# Patient Record
Sex: Male | Born: 1980 | Hispanic: Yes | Marital: Single | Smoking: Never smoker
Health system: Southern US, Community
[De-identification: ages and names within clinical notes are randomized; demographics above are authoritative.]

---

## 2013-12-15 ENCOUNTER — Emergency Department (HOSPITAL_COMMUNITY)
Admission: EM | Admit: 2013-12-15 | Discharge: 2013-12-15 | Disposition: A | Discharge status: Home / Self Care | Payer: No Typology Code available for payment source | Attending: Emergency Medicine | Admitting: Emergency Medicine

## 2013-12-15 ENCOUNTER — Encounter (HOSPITAL_COMMUNITY): Payer: Self-pay | Admitting: Emergency Medicine

## 2013-12-15 DIAGNOSIS — H902 Conductive hearing loss, unspecified: Secondary | ICD-10-CM | POA: Insufficient documentation

## 2013-12-15 DIAGNOSIS — H6120 Impacted cerumen, unspecified ear: Secondary | ICD-10-CM | POA: Insufficient documentation

## 2013-12-15 MED ORDER — ANTIPYRINE-BENZOCAINE 5.4-1.4 % OT SOLN: 3.0000 [drp] | OTIC | Status: AC | PRN

## 2013-12-15 MED ORDER — DOCUSATE SODIUM 50 MG/5ML PO LIQD
50.0000 mg | Freq: Once | ORAL | Status: AC
  Administered 2013-12-15: 50 mg via ORAL
  Filled 2013-12-15: qty 10

## 2013-12-15 NOTE — ED Provider Notes (Signed)
Medical screening examination/treatment/procedure(s) were performed by non-physician practitioner and as supervising physician I was immediately available for consultation/collaboration.   EKG Interpretation None        Zakyria Metzinger N Karlye Ihrig, DO 12/15/13 2259 

## 2013-12-15 NOTE — Discharge Instructions (Signed)
Apply colace to left ear and left sit for 10-15 minutes.  Irrigate ear afterwards for several minutes. Avoid putting q-tips or other objects into ears. May follow up with ENT if problems occur. Return to the ED for new concerns.

## 2013-12-15 NOTE — ED Provider Notes (Signed)
CSN: [id number]     Arrival date & time 12/15/13  1707 History  This chart was scribed for non-physician practitioner working with Layla MawKristen N Ward, DO by Ashley JacobsBrittany Andrews, ED scribe. This patient was seen in room WTR6/WTR6 and the patient's care was started at 5:49 PM.  First MD Initiated Contact with Patient 12/15/13 1719     Chief Complaint  Patient presents with  . Otalgia     (Consider location/radiation/quality/duration/timing/severity/associated sxs/prior Treatment) The history is provided by the patient and medical records. No language interpreter was used.   HPI Comments: Shane Novak is a 33 y.o. male who presents to the Emergency Department complaining of constant, moderate ear pain for the past three days after attempting to clean his ear with a Q-tip. Pt mentions having muffled hearing and having a throbbing/pulsating pain in his left ear. Pt has tried Ibuprofen to alleviate the the pain to no relief.   No prior hx of ear problems or TM injuries.    No past medical history on file. No past surgical history on file. No family history on file. History  Substance Use Topics  . Smoking status: Not on file  . Smokeless tobacco: Not on file  . Alcohol Use: Not on file    Review of Systems  HENT: Positive for ear pain and hearing loss.   All other systems reviewed and are negative.      Allergies  Review of patient's allergies indicates no known allergies.  Home Medications  No current outpatient prescriptions on file. BP 140/80  Pulse 79  Resp 16  SpO2 100%  Physical Exam  Nursing note and vitals reviewed. Constitutional: He is oriented to person, place, and time. He appears well-developed and well-nourished. No distress.  HENT:  Head: Normocephalic and atraumatic.  Right Ear: Decreased hearing is noted.  Left Ear: Decreased hearing is noted.  Mouth/Throat: Uvula is midline and oropharynx is clear and moist.  Bilateral cerumen impaction; decreased  hearing bilaterally  Eyes: Conjunctivae and EOM are normal. Pupils are equal, round, and reactive to light.  Neck: Normal range of motion.  Cardiovascular: Normal rate, regular rhythm and normal heart sounds.   Pulmonary/Chest: Effort normal and breath sounds normal. No respiratory distress. He has no wheezes.  Musculoskeletal: Normal range of motion.  Neurological: He is alert and oriented to person, place, and time.  Skin: Skin is warm and dry. He is not diaphoretic.  Psychiatric: He has a normal mood and affect.    ED Course  EAR CERUMEN REMOVAL Date/Time: 12/15/2013 7:53 PM Performed by: Garlon HatchetSANDERS, Krystopher Kuenzel M Authorized by: Garlon HatchetSANDERS, Natasha Burda M Consent: Verbal consent obtained. Risks and benefits: risks, benefits and alternatives were discussed Consent given by: patient Patient understanding: patient states understanding of the procedure being performed Patient identity confirmed: verbally with patient Local anesthetic: none Ceruminolytics applied: Ceruminolytics applied prior to the procedure. Location details: right ear Procedure type: curette and irrigation Patient sedated: no Comments: Colace applied to ear, ear was irrigated.  Large amount of hardened ear wax removed from right ear with curette resulting in clear visualization of intact TM.  EAR CERUMEN REMOVAL Date/Time: 12/15/2013 7:54 PM Performed by: Garlon HatchetSANDERS, Clorine Swing M Authorized by: Garlon HatchetSANDERS, Cruzito Standre M Consent: Verbal consent obtained. Risks and benefits: risks, benefits and alternatives were discussed Consent given by: patient Patient understanding: patient states understanding of the procedure being performed Patient identity confirmed: verbally with patient Ceruminolytics applied: Ceruminolytics applied prior to the procedure. Location details: left ear Procedure type: curette and irrigation  Patient sedated: no Comments: Colace applied to ear, ear was irrigated.  Cerumen removed with curette with partial visualization of left TM.   What was visualized appear intact and non-infectious.   (including critical care time)   COORDINATION OF CARE:  5:53 PM Discussed course of care with pt which includes removing the cerumen impaction and colace 50 mg/mL. Pt understands and agrees.   Labs Review Labs Reviewed - No data to display Imaging Review No results found.   EKG Interpretation None      MDM   Final diagnoses:  Cerumen impaction   Bilateral cerumen impaction.  After irrigation and cleaning, clear visualization of right TM, partial visualization of left TM.  No signs of TM perforation or infection.  Pt states ears feels better and hearing feels normal again.  His ears are somewhat painful after cleaning.  Will give auralgan drops for comfort.  FU with ENT if needed.  Advised not to use q-tips to clean ears.  Discussed plan with pt, they agreed.  Return precautions advised.  I personally performed the services described in this documentation, which was scribed in my presence. The recorded information has been reviewed and is accurate.    Garlon Hatchet, PA-C 12/15/13 1955

## 2013-12-15 NOTE — ED Notes (Signed)
Irrigated both ears until clear. Ear wax return noted.

## 2013-12-15 NOTE — ED Notes (Signed)
Patient refused for vital signs to be taken

## 2013-12-15 NOTE — ED Notes (Signed)
Pt. states L Ear has been hurting since Wednesday March 4th after using a Q-Tip to clean ears. Pt. has been taking ibuprofen since Friday March 6th for pain. Patients states hearing is difficult with L ear due to beating noise in his ear.

## 2015-03-23 ENCOUNTER — Emergency Department (HOSPITAL_COMMUNITY)
Admission: EM | Admit: 2015-03-23 | Discharge: 2015-03-23 | Disposition: A | Discharge status: Home / Self Care | Payer: 59 | Attending: Emergency Medicine | Admitting: Emergency Medicine

## 2015-03-23 ENCOUNTER — Encounter (HOSPITAL_COMMUNITY): Payer: Self-pay | Admitting: Emergency Medicine

## 2015-03-23 DIAGNOSIS — L239 Allergic contact dermatitis, unspecified cause: Secondary | ICD-10-CM

## 2015-03-23 DIAGNOSIS — R21 Rash and other nonspecific skin eruption: Secondary | ICD-10-CM | POA: Diagnosis present

## 2015-03-23 MED ORDER — HYDROXYZINE HCL 25 MG PO TABS: 25.0000 mg | ORAL_TABLET | Freq: Four times a day (QID) | ORAL | Status: AC

## 2015-03-23 MED ORDER — PREDNISONE 20 MG PO TABS: ORAL_TABLET | ORAL | Status: AC

## 2015-03-23 MED ORDER — TRIAMCINOLONE ACETONIDE 0.025 % EX OINT: 1.0000 "application " | TOPICAL_OINTMENT | Freq: Two times a day (BID) | CUTANEOUS | Status: AC

## 2015-03-23 MED ORDER — PREDNISONE 20 MG PO TABS
60.0000 mg | ORAL_TABLET | Freq: Once | ORAL | Status: AC
  Administered 2015-03-23: 60 mg via ORAL
  Filled 2015-03-23: qty 3

## 2015-03-23 NOTE — ED Notes (Signed)
Pt c/o raised, red, itchy rash across jaw, chest arm, back, and legs onset Friday after eating out. Pt states he took 25 mg benadryl at 1200 today.

## 2015-03-23 NOTE — ED Provider Notes (Signed)
CSN: [id number]     Arrival date & time 03/23/15  1633 History  This chart was scribed for non-physician practitioner, Arthor Captain PA,  working with Eber Hong, MD, by Phillis Haggis, ED Scribe. This patient was seen in room WTR8/WTR8 and the patient's care was started at 6:14 PM.  No chief complaint on file.  The history is provided by the patient. No language interpreter was used.    HPI Comments: Shane Novak is a 34 y.o. male who presents to the Emergency Department complaining of a gradually worsening, itchy rash onset 2 days ago. He states that he went to a [place] buffet on Friday where he ate a lot of different foods; states he woke up with a rash the next day over his jaw line, abdomen and chest, arms, legs, and back. He states taking benadryl to no relief. He denies chest pain, shortness of breath, trouble swallowing, tongue or lip swelling. He denies changes in detergent or soaps. He denies having a PCP.   History reviewed. No pertinent past medical history. History reviewed. No pertinent past surgical history. No family history on file. History  Substance Use Topics  . Smoking status: Never Smoker   . Smokeless tobacco: Not on file  . Alcohol Use: No    Review of Systems  HENT: Negative for trouble swallowing.   Respiratory: Negative for shortness of breath.   Cardiovascular: Negative for chest pain.  Skin: Positive for rash.    Allergies  Review of patient's allergies indicates no known allergies.  Home Medications   Prior to Admission medications   Medication Sig Start Date End Date Taking? Authorizing Provider  antipyrine-benzocaine Lyla Son) otic solution Place 3-4 drops into both ears every 2 (two) hours as needed for ear pain. 12/15/13   Garlon Hatchet, PA-C   Triage Vitals: BP 116/72 mmHg  Pulse 76  Temp(Src) 98.1 F (36.7 C) (Oral)  Resp 18  SpO2 99%   Physical Exam  Constitutional: He is oriented to person, place, and time. He appears  well-developed and well-nourished. No distress.  HENT:  Head: Normocephalic and atraumatic.  Mouth/Throat: Oropharynx is clear and moist. No oropharyngeal exudate.  No oropharyngeal swelling; no swelling under the tongue   Eyes: Conjunctivae and EOM are normal.  Neck: Neck supple. No tracheal deviation present.  Cardiovascular: Normal rate.   Pulmonary/Chest: Effort normal and breath sounds normal. No respiratory distress.  Musculoskeletal: Normal range of motion.  Neurological: He is alert and oriented to person, place, and time.  Skin: Skin is warm and dry. Rash noted. Rash is urticarial.  Multiple singular confluent erythematous papules to the abdomen, chest wall, and in the beard area; some erupting on the arms.   Psychiatric: He has a normal mood and affect. His behavior is normal.  Nursing note and vitals reviewed.   ED Course  Procedures (including critical care time)  DIAGNOSTIC STUDIES: Oxygen Saturation is 99% on RA, normal by my interpretation.    COORDINATION OF CARE: 6:19 PM-Discussed treatment plan which includes prednisone, cortisone cream, and benadryl; referral to allergist with pt at bedside and pt agreed to plan.   Labs Review Labs Reviewed - No data to display  Imaging Review No results found.   EKG Interpretation None      MDM   Final diagnoses:  Allergic dermatitis   Patient with allergic dermatitis. No signs of anaphylaxis or airway compromise. Patient will be discharged with triamcinolone ointment, prednisone, Vistaril. He appears safe for discharge at this  time  I personally performed the services described in this documentation, which was scribed in my presence. The recorded information has been reviewed and is accurate.        Arthor Captain, PA-C 03/23/15 2147  Eber Hong, MD 03/24/15 1225

## 2015-03-23 NOTE — Discharge Instructions (Signed)
Shane Novak (Food Allergy) Las Company secretary se producen al comer algo hacia lo cual se tiene sensibilidad. Las alergias alimentarias pueden ocurrir a Hotel manager. Pueden ser transmitidas por los padres (hereditarias).  CAUSAS Algunos de los alimentos que comnmente producen alergia son la [place], los frutos de mar, los [place], los frutos secos (incluyendo la [place] de man), el trigo y la soja. SNTOMAS Los problemas ms frecuentes son  Hinchazn alrededor de la boca.  Una erupcin roja que produce picazn.  Ronchas.  Vmitos.  Diarrea. Las Chief of Staff graves ponen en peligro la vida. Esta reaccin se denomina anafilaxis. Puede causar hinchazn en la boca y Patent examiner. Esto dificulta la respiracin y la deglucin. En casos de reacciones graves, slo una pequea cantidad de comida puede ser mortal en unos pocos segundos. INSTRUCCIONES PARA EL CUIDADO DOMICILIARIO  Si no est seguro de que es lo que le produce la reaccin, [place] manager un registro de los alimentos que come y los sntomas que le siguen. Evite los Nurse, mental health.  Si presenta urticaria o una erupcin cutnea:  Tome los medicamentos como se le indic.  Utilice un antihistamnico de Radio broadcast assistant (difenhidramina) para las ronchas y Cabin crew, segn sea necesario.  Aplquese compresas fras sobre la piel o tome baos de agua fra. Evite los [place] calientes. Estos aumentarn el enrojecimiento y Cabin crew.  Si usted es muy alrgico:  Generalmente es necesaria la hospitalizacin luego de una reaccin grave.  Utilice un brazalete o collar de alerta mdico, indicando el tipo de [place].  Lleve siempre con usted el kit para anafilaxis o la inyeccin de epinefrina Tanto usted como los [employer] de su familia deberan saber como utilizarlo. Esto podr salvarle la vida si tiene una reaccin grave. Si ha usado la epinefrina, es importante que solicite atencin mdica  de inmediato o se comunique con el servicio de urgencias de su localidad (911 en los [place]). Cuando el efecto de la epinefrina desaparece, puede seguir una reaccin tarda que puede resultar mortal.  Reponga la epinefrina inmediatamente despus de usarla en caso de otra reaccin.  Pdale informacin al mdico si no le han enseado a usar la inyeccin de epinefrina.  No conduzca hasta que desaparezca el efecto de los medicamentos para tratar la reaccin, a menos que el profesional que lo asiste lo autorice. SOLICITE ATENCIN MDICA SI:  Sospecha que puede sufrir una alergia a algn alimento. Los sntomas generalmente ocurren dentro de los 30 minutos posteriores a haber ingerido el alimento.  Los sntomas persistieron durante 2 das. Concurra al profesional que lo asiste rpidamente si los sntomas empeoran.  Desarrolla nuevos sntomas.  Quiere volver a probar un alimento o bebida que usted cree que le causa una reaccin IT consultant. Nunca lo haga si ha sufrido una reaccin anafilctica a ese alimento o a esa bebida con anterioridad. Ronchas  (Hives)  Las ronchas son reas de la piel inflamadas (hinchadas) rojas y que pican. Pueden cambiar de tamao y de ubicacin en el cuerpo. Las Administrator, Civil Service y Armed forces operational officer durante algunas horas o das (ronchas agudas) o durante algunas semanas (ronchas crnicas). No pueden transmitirse de [place] persona a Alcus Dad (no son contagiosas). Pueden empeorar al rascarse, hacer ejercicios y por estrs emocional.  CAUSAS  Reaccin alrgica a alimentos, aditivos o frmacos. Infecciones, incluso el resfro comn. Enfermedades, como la vasculitis, el lupus o la enfermedad tiroidea. Exposicin al sol, al calor o al fro. La prctica de ejercicios. El estrs.  El contacto con algunas sustancias qumicas. SNTOMAS  Zonas hinchadas, rojas o blancas, sobre la piel. Las ronchas pueden cambiar de [place], forma, [place] y Teacher, English as a foreign language. Picazn. Hinchazn de las [employer] y [place]. Esto puede ocurrir si las ronchas se desarrollan en capas profundas de la piel. DIAGNSTICO  El mdico puede diagnosticar el problema haciendo un examen fsico. [place] Comment indicar anlisis de sangre o un estudio de la piel para Office manager causa. En algunos casos, no puede determinarse la causa.  TRATAMIENTO  Los casos leves generalmente mejoran con medicamentos como los antihistamnicos. Los casos ms graves pueden requerir una inyeccin de epinefrina de Freight forwarder. Si se conoce la causa de la urticaria, el tratamiento incluye evitar el factor desencadenante.  INSTRUCCIONES PARA EL CUIDADO EN EL HOGAR  Evite las causas que han desencadenado las ronchas. Tome los antihistamnicos segn las indicaciones del mdico para reducir la gravedad de las ronchas. Generalmente se recomiendan los [employer] no son sedantes o con bajo efecto sedante. No conduzca vehculos mientras toma antihistamnicos. Tome los medicamentos para la picazn exactamente como le indic el mdico. Use ropas sueltas. Cumpla con todas las visitas de control, segn le indique su mdico. SOLICITE ATENCIN MDICA SI:  Siente una picazn intensa o persistente que no se calma con los medicamentos. [place] articulaciones o estn inflamadas. SOLICITE ATENCIN MDICA DE INMEDIATO SI:  Tiene fiebre. Tiene la boca o los labios hinchados. Tiene problemas para respirar o tragar. Siente una opresin en la garganta o en el pecho. Siente dolor abdominal. Estos problemas pueden ser los primeros signos de una reaccin alrgica que ponga en peligro la vida. Llame a los servicios de emergencia locales (911 en [place]). ASEGRESE DE QUE:  Comprende estas instrucciones. Controlar su enfermedad. Solicitar ayuda de inmediato si no mejora o si empeora. Document Released: 09/27/2005 Document Revised: 10/02/2013 [facility] Patient Information 2015  [place]. This information is not intended to replace advice given to you by your health care provider. Make sure you discuss any questions you have with your health care provider.  Reaparecen los sntomas que lo llevaron a la consulta con el profesional. SOLICITE ATENCIN MDICA DE INMEDIATO SI:  Presenta dificultad para respirar, [place] o tiene una sensacin de opresin en el pecho o en la garganta.  Tiene la boca hinchada, o presenta urticaria, hinchazn o picazn en todo el cuerpo. Use inmediatamente la inyeccin de epinefrina. Se aplica en la parte externa del muslo, bien profundo dentro del msculo. Luego de usar la inyeccin de epinefrina, solicite ayuda inmediatamente. Solicite atencin mdica de inmediato o comunquese con el servicio de urgencias de su localidad (911 en los [place]). EST SEGURO QUE:   Comprende las instrucciones para el alta mdica.  Controlar su enfermedad.  Solicitar atencin mdica de inmediato segn las indicaciones. Document Released: 09/27/2005 Document Revised: 12/20/2011 [facility] Patient Information 2015 [place]. This information is not intended to replace advice given to you by your health care provider. Make sure you discuss any questions you have with your health care provider.

## 2017-03-29 ENCOUNTER — Ambulatory Visit
Admission: RE | Admit: 2017-03-29 | Discharge: 2017-03-29 | Disposition: A | Discharge status: Home / Self Care | Payer: 59 | Source: Ambulatory Visit | Attending: Chiropractic Medicine | Admitting: Chiropractic Medicine

## 2017-03-29 ENCOUNTER — Other Ambulatory Visit: Payer: Self-pay | Admitting: Chiropractic Medicine

## 2017-03-29 DIAGNOSIS — M791 Myalgia, unspecified site: Secondary | ICD-10-CM

## 2018-05-05 ENCOUNTER — Ambulatory Visit
Admission: RE | Admit: 2018-05-05 | Discharge: 2018-05-05 | Disposition: A | Discharge status: Home / Self Care | Payer: BLUE CROSS/BLUE SHIELD | Source: Ambulatory Visit | Attending: Family Medicine | Admitting: Family Medicine

## 2018-05-05 ENCOUNTER — Other Ambulatory Visit: Payer: Self-pay | Admitting: Family Medicine

## 2018-05-05 DIAGNOSIS — M542 Cervicalgia: Secondary | ICD-10-CM

## 2018-05-05 DIAGNOSIS — R200 Anesthesia of skin: Secondary | ICD-10-CM

## 2018-08-03 ENCOUNTER — Ambulatory Visit (INDEPENDENT_AMBULATORY_CARE_PROVIDER_SITE_OTHER): Payer: BLUE CROSS/BLUE SHIELD | Admitting: Psychology

## 2018-08-03 DIAGNOSIS — F331 Major depressive disorder, recurrent, moderate: Secondary | ICD-10-CM | POA: Diagnosis not present

## 2018-08-08 ENCOUNTER — Ambulatory Visit: Payer: Self-pay | Admitting: Psychology

## 2018-08-09 ENCOUNTER — Ambulatory Visit: Payer: Self-pay | Admitting: Psychology

## 2018-08-17 ENCOUNTER — Ambulatory Visit (INDEPENDENT_AMBULATORY_CARE_PROVIDER_SITE_OTHER): Payer: BLUE CROSS/BLUE SHIELD | Admitting: Psychology

## 2018-08-17 DIAGNOSIS — F331 Major depressive disorder, recurrent, moderate: Secondary | ICD-10-CM

## 2018-09-18 ENCOUNTER — Ambulatory Visit (INDEPENDENT_AMBULATORY_CARE_PROVIDER_SITE_OTHER): Payer: BLUE CROSS/BLUE SHIELD | Admitting: Psychology

## 2018-09-18 DIAGNOSIS — F331 Major depressive disorder, recurrent, moderate: Secondary | ICD-10-CM | POA: Diagnosis not present

## 2018-10-26 ENCOUNTER — Ambulatory Visit: Payer: Self-pay | Admitting: Psychology

## 2018-11-06 ENCOUNTER — Ambulatory Visit: Payer: BLUE CROSS/BLUE SHIELD | Admitting: Psychology

## 2018-11-09 ENCOUNTER — Ambulatory Visit: Payer: Self-pay | Admitting: Psychology

## 2018-11-20 ENCOUNTER — Ambulatory Visit: Payer: BLUE CROSS/BLUE SHIELD | Admitting: Psychology

## 2018-11-21 ENCOUNTER — Ambulatory Visit: Payer: Self-pay | Admitting: Psychology

## 2018-12-04 ENCOUNTER — Ambulatory Visit: Payer: BLUE CROSS/BLUE SHIELD | Admitting: Psychology

## 2018-12-05 ENCOUNTER — Ambulatory Visit: Payer: BLUE CROSS/BLUE SHIELD | Admitting: Psychology

## 2018-12-15 IMAGING — CR DG CERVICAL SPINE COMPLETE 4+V
6 series · 6 of 6 positions shown · non-contrast
Comparison: None.

CLINICAL DATA: Neck pain and numbness of left hand

EXAM:
CERVICAL SPINE - COMPLETE 4+ VIEW

[w cervical spine lat]
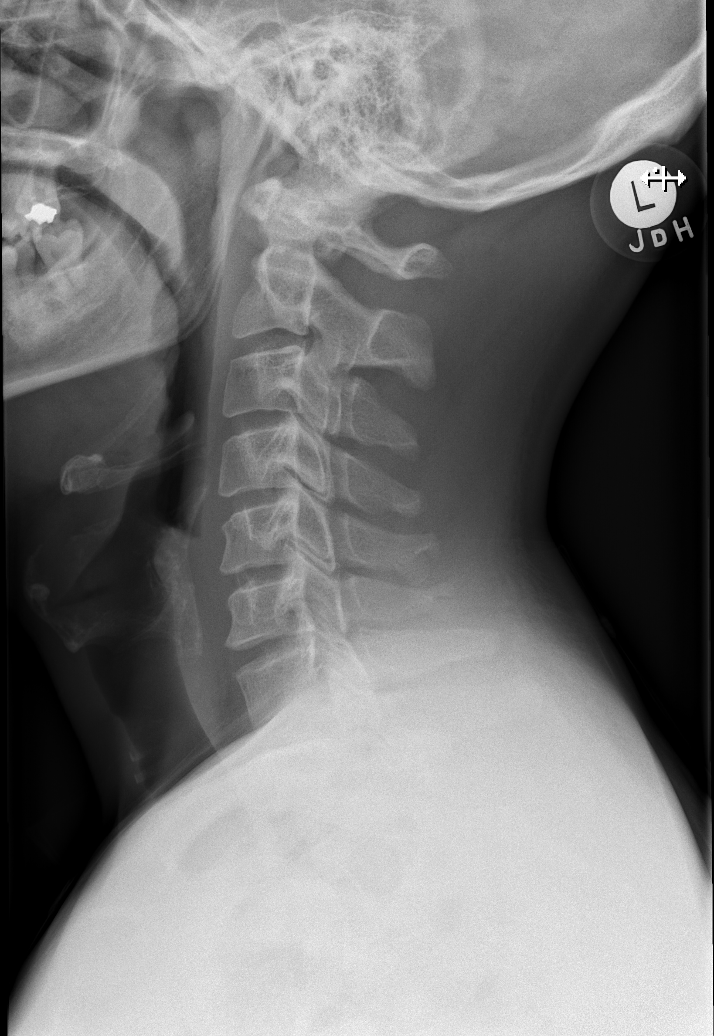

[w cervical swimmers]
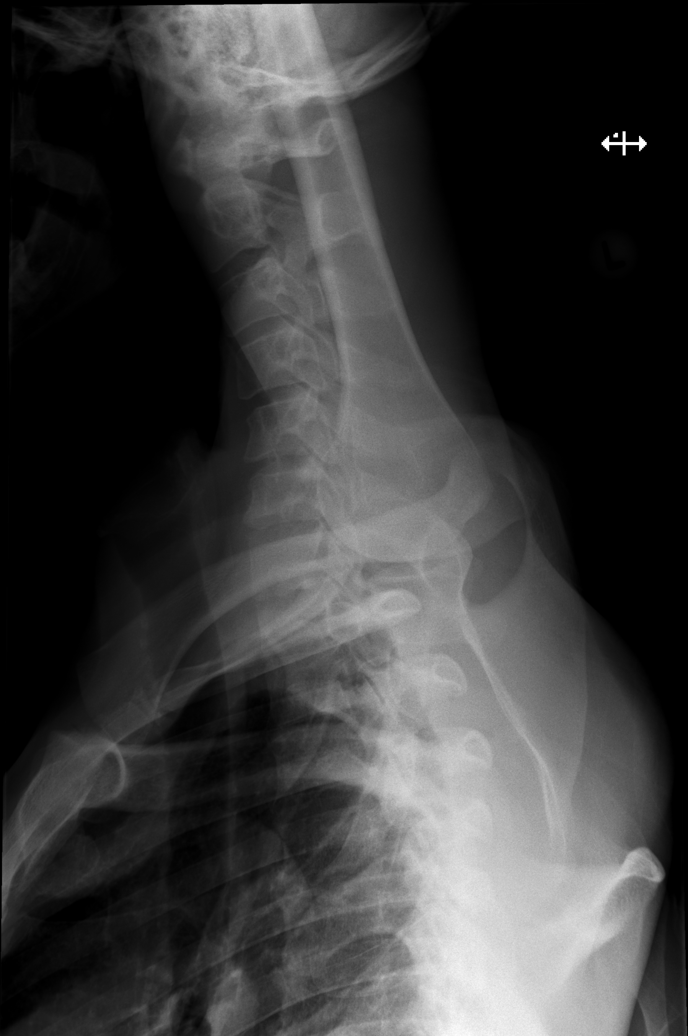

[w cervical spine ap_obl (1 of 2)]
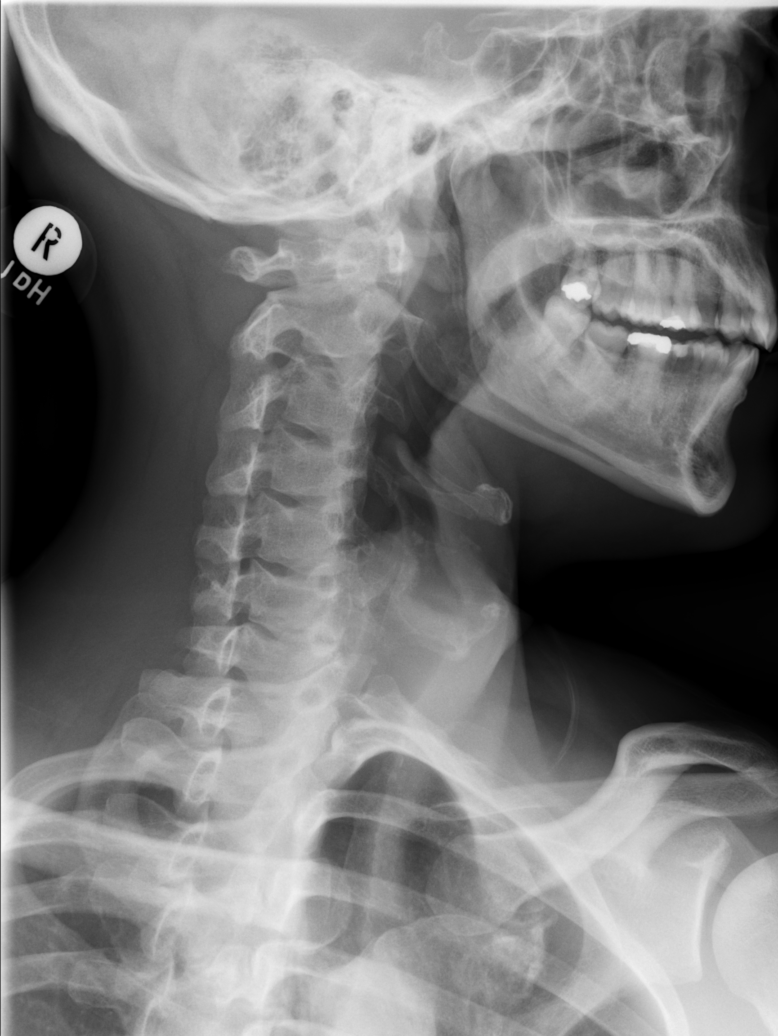

[w cervical spine ap_obl (2 of 2)]
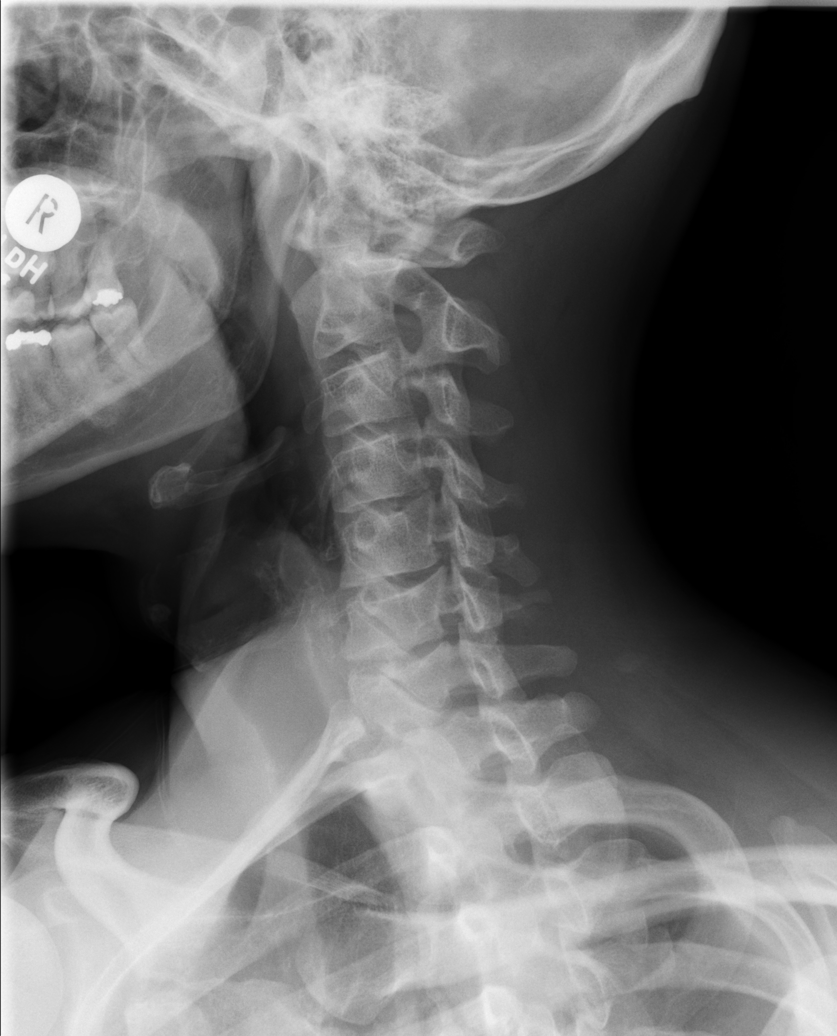

[w cervical spine ap]
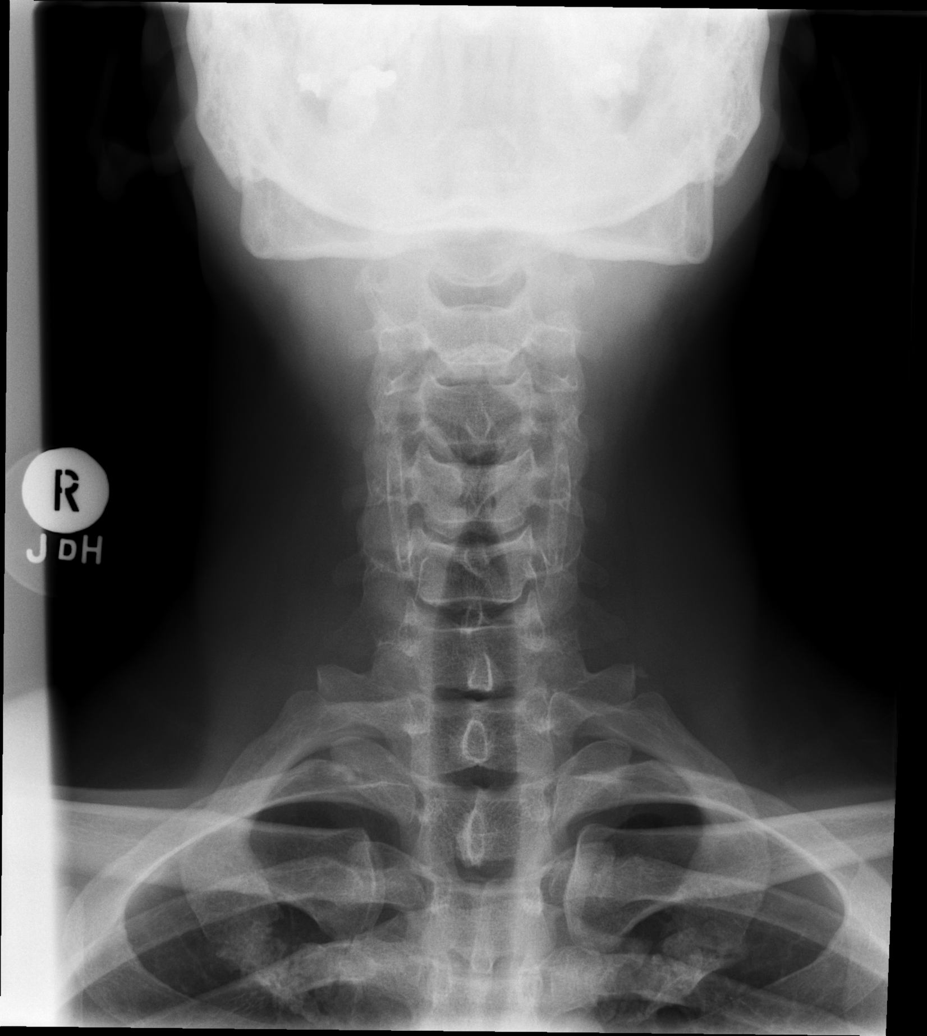

[w cervical spine odontoid]
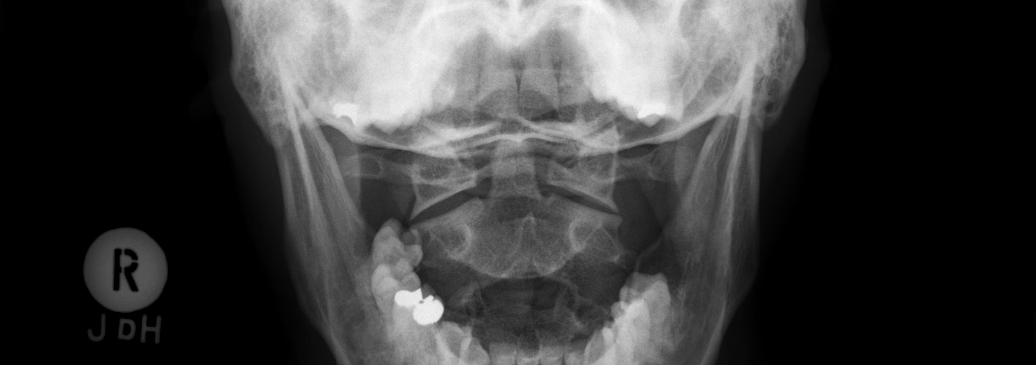

[6 of 6 positions shown; findings below may reference images not displayed]

FINDINGS: Seven cervical segments are well visualized. Vertebral body height
is well maintained. No alignment abnormality is noted. The neural
foramina are widely patent bilaterally. No soft tissue changes are
seen. The odontoid is within normal limits.
IMPRESSION: No acute abnormality noted.

## 2019-08-07 ENCOUNTER — Other Ambulatory Visit: Payer: Self-pay

## 2019-08-07 DIAGNOSIS — Z20822 Contact with and (suspected) exposure to covid-19: Secondary | ICD-10-CM

## 2019-08-09 LAB — NOVEL CORONAVIRUS, NAA: SARS-CoV-2, NAA: NOT DETECTED
